# Patient Record
Sex: Female | Born: 1990 | Race: White | Hispanic: No | Marital: Single | Smoking: Never smoker
Health system: Southern US, Community
[De-identification: ages and names within clinical notes are randomized; demographics above are authoritative.]

## PROBLEM LIST (undated history)

## (undated) DIAGNOSIS — Z803 Family history of malignant neoplasm of breast: Secondary | ICD-10-CM

## (undated) DIAGNOSIS — Z808 Family history of malignant neoplasm of other organs or systems: Secondary | ICD-10-CM

## (undated) DIAGNOSIS — Z8041 Family history of malignant neoplasm of ovary: Secondary | ICD-10-CM

## (undated) HISTORY — DX: Family history of malignant neoplasm of breast: Z80.3

## (undated) HISTORY — DX: Family history of malignant neoplasm of other organs or systems: Z80.8

## (undated) HISTORY — DX: Family history of malignant neoplasm of ovary: Z80.41

---
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
## 2008-05-17 ENCOUNTER — Other Ambulatory Visit: Admission: RE | Admit: 2008-05-17 | Discharge: 2008-05-17 | Payer: Self-pay | Admitting: Family Medicine

## 2009-10-08 ENCOUNTER — Other Ambulatory Visit: Admission: RE | Admit: 2009-10-08 | Discharge: 2009-10-08 | Payer: Self-pay | Admitting: Family Medicine

## 2010-10-10 ENCOUNTER — Other Ambulatory Visit (HOSPITAL_COMMUNITY)
Admission: RE | Admit: 2010-10-10 | Discharge: 2010-10-10 | Disposition: A | Discharge status: Home / Self Care | Payer: Commercial Indemnity | Source: Ambulatory Visit | Attending: Family Medicine | Admitting: Family Medicine

## 2010-10-10 ENCOUNTER — Other Ambulatory Visit: Payer: Self-pay | Admitting: Family Medicine

## 2010-10-10 ENCOUNTER — Other Ambulatory Visit (HOSPITAL_COMMUNITY): Admission: RE | Admit: 2010-10-10 | Payer: Commercial Indemnity | Source: Ambulatory Visit | Admitting: Family Medicine

## 2010-10-10 DIAGNOSIS — Z124 Encounter for screening for malignant neoplasm of cervix: Secondary | ICD-10-CM | POA: Insufficient documentation

## 2010-10-10 DIAGNOSIS — Z113 Encounter for screening for infections with a predominantly sexual mode of transmission: Secondary | ICD-10-CM | POA: Insufficient documentation

## 2011-10-12 ENCOUNTER — Other Ambulatory Visit (HOSPITAL_COMMUNITY)
Admission: RE | Admit: 2011-10-12 | Discharge: 2011-10-12 | Disposition: A | Discharge status: Home / Self Care | Payer: Commercial Managed Care - PPO | Source: Ambulatory Visit | Attending: Family Medicine | Admitting: Family Medicine

## 2011-10-12 ENCOUNTER — Other Ambulatory Visit: Payer: Self-pay | Admitting: Family Medicine

## 2011-10-12 DIAGNOSIS — Z124 Encounter for screening for malignant neoplasm of cervix: Secondary | ICD-10-CM | POA: Insufficient documentation

## 2011-10-12 DIAGNOSIS — Z113 Encounter for screening for infections with a predominantly sexual mode of transmission: Secondary | ICD-10-CM | POA: Insufficient documentation

## 2012-10-19 ENCOUNTER — Other Ambulatory Visit (HOSPITAL_COMMUNITY)
Admission: RE | Admit: 2012-10-19 | Discharge: 2012-10-19 | Disposition: A | Discharge status: Home / Self Care | Payer: Commercial Managed Care - PPO | Source: Ambulatory Visit | Attending: Family Medicine | Admitting: Family Medicine

## 2012-10-19 ENCOUNTER — Other Ambulatory Visit: Payer: Self-pay | Admitting: Family Medicine

## 2012-10-19 DIAGNOSIS — Z124 Encounter for screening for malignant neoplasm of cervix: Secondary | ICD-10-CM | POA: Insufficient documentation

## 2012-10-19 DIAGNOSIS — Z113 Encounter for screening for infections with a predominantly sexual mode of transmission: Secondary | ICD-10-CM | POA: Insufficient documentation

## 2013-11-16 ENCOUNTER — Other Ambulatory Visit (HOSPITAL_COMMUNITY)
Admission: RE | Admit: 2013-11-16 | Discharge: 2013-11-16 | Disposition: A | Discharge status: Home / Self Care | Payer: Commercial Managed Care - PPO | Source: Ambulatory Visit | Attending: Family Medicine | Admitting: Family Medicine

## 2013-11-16 ENCOUNTER — Other Ambulatory Visit: Payer: Self-pay | Admitting: Family Medicine

## 2013-11-16 DIAGNOSIS — Z113 Encounter for screening for infections with a predominantly sexual mode of transmission: Secondary | ICD-10-CM | POA: Insufficient documentation

## 2013-11-16 DIAGNOSIS — Z124 Encounter for screening for malignant neoplasm of cervix: Secondary | ICD-10-CM | POA: Insufficient documentation

## 2013-11-16 DIAGNOSIS — Z1151 Encounter for screening for human papillomavirus (HPV): Secondary | ICD-10-CM | POA: Insufficient documentation

## 2015-10-15 ENCOUNTER — Other Ambulatory Visit (HOSPITAL_COMMUNITY)
Admission: RE | Admit: 2015-10-15 | Discharge: 2015-10-15 | Disposition: A | Discharge status: Home / Self Care | Payer: 59 | Source: Ambulatory Visit | Attending: Family Medicine | Admitting: Family Medicine

## 2015-10-15 ENCOUNTER — Other Ambulatory Visit: Payer: Self-pay | Admitting: Family Medicine

## 2015-10-15 DIAGNOSIS — Z1151 Encounter for screening for human papillomavirus (HPV): Secondary | ICD-10-CM | POA: Insufficient documentation

## 2015-10-15 DIAGNOSIS — Z124 Encounter for screening for malignant neoplasm of cervix: Secondary | ICD-10-CM | POA: Insufficient documentation

## 2015-10-17 ENCOUNTER — Other Ambulatory Visit: Payer: Self-pay | Admitting: Family Medicine

## 2015-10-17 DIAGNOSIS — Z8041 Family history of malignant neoplasm of ovary: Secondary | ICD-10-CM

## 2015-10-17 DIAGNOSIS — N6459 Other signs and symptoms in breast: Secondary | ICD-10-CM

## 2015-10-18 LAB — CYTOLOGY - PAP

## 2015-10-23 ENCOUNTER — Ambulatory Visit
Admission: RE | Admit: 2015-10-23 | Discharge: 2015-10-23 | Disposition: A | Discharge status: Home / Self Care | Payer: 59 | Source: Ambulatory Visit | Attending: Family Medicine | Admitting: Family Medicine

## 2015-10-23 ENCOUNTER — Other Ambulatory Visit: Payer: Self-pay | Admitting: Family Medicine

## 2015-10-23 DIAGNOSIS — N6459 Other signs and symptoms in breast: Secondary | ICD-10-CM

## 2015-10-30 ENCOUNTER — Ambulatory Visit
Admission: RE | Admit: 2015-10-30 | Discharge: 2015-10-30 | Disposition: A | Discharge status: Home / Self Care | Payer: 59 | Source: Ambulatory Visit | Attending: Family Medicine | Admitting: Family Medicine

## 2015-10-30 DIAGNOSIS — Z8041 Family history of malignant neoplasm of ovary: Secondary | ICD-10-CM

## 2016-04-22 ENCOUNTER — Other Ambulatory Visit: Payer: Self-pay | Admitting: Family Medicine

## 2016-04-22 ENCOUNTER — Inpatient Hospital Stay (HOSPITAL_COMMUNITY): Admission: RE | Admit: 2016-04-22 | Payer: Self-pay | Source: Ambulatory Visit

## 2016-04-23 LAB — CYTOLOGY - PAP

## 2016-10-23 DIAGNOSIS — R002 Palpitations: Secondary | ICD-10-CM | POA: Diagnosis not present

## 2016-10-23 DIAGNOSIS — Z8041 Family history of malignant neoplasm of ovary: Secondary | ICD-10-CM | POA: Diagnosis not present

## 2016-10-23 DIAGNOSIS — Z309 Encounter for contraceptive management, unspecified: Secondary | ICD-10-CM | POA: Diagnosis not present

## 2016-10-23 DIAGNOSIS — Z803 Family history of malignant neoplasm of breast: Secondary | ICD-10-CM | POA: Diagnosis not present

## 2016-10-23 DIAGNOSIS — Z0000 Encounter for general adult medical examination without abnormal findings: Secondary | ICD-10-CM | POA: Diagnosis not present

## 2016-10-23 DIAGNOSIS — Z23 Encounter for immunization: Secondary | ICD-10-CM | POA: Diagnosis not present

## 2017-07-14 DIAGNOSIS — M9903 Segmental and somatic dysfunction of lumbar region: Secondary | ICD-10-CM | POA: Diagnosis not present

## 2017-07-14 DIAGNOSIS — R51 Headache: Secondary | ICD-10-CM | POA: Diagnosis not present

## 2017-07-14 DIAGNOSIS — M5431 Sciatica, right side: Secondary | ICD-10-CM | POA: Diagnosis not present

## 2017-07-14 DIAGNOSIS — M9901 Segmental and somatic dysfunction of cervical region: Secondary | ICD-10-CM | POA: Diagnosis not present

## 2017-07-15 DIAGNOSIS — M9903 Segmental and somatic dysfunction of lumbar region: Secondary | ICD-10-CM | POA: Diagnosis not present

## 2017-07-15 DIAGNOSIS — M9901 Segmental and somatic dysfunction of cervical region: Secondary | ICD-10-CM | POA: Diagnosis not present

## 2017-07-15 DIAGNOSIS — R51 Headache: Secondary | ICD-10-CM | POA: Diagnosis not present

## 2017-07-15 DIAGNOSIS — M5431 Sciatica, right side: Secondary | ICD-10-CM | POA: Diagnosis not present

## 2017-07-19 DIAGNOSIS — M9903 Segmental and somatic dysfunction of lumbar region: Secondary | ICD-10-CM | POA: Diagnosis not present

## 2017-07-19 DIAGNOSIS — R51 Headache: Secondary | ICD-10-CM | POA: Diagnosis not present

## 2017-07-19 DIAGNOSIS — M5431 Sciatica, right side: Secondary | ICD-10-CM | POA: Diagnosis not present

## 2017-07-19 DIAGNOSIS — M9901 Segmental and somatic dysfunction of cervical region: Secondary | ICD-10-CM | POA: Diagnosis not present

## 2017-07-21 DIAGNOSIS — M9901 Segmental and somatic dysfunction of cervical region: Secondary | ICD-10-CM | POA: Diagnosis not present

## 2017-07-21 DIAGNOSIS — M5431 Sciatica, right side: Secondary | ICD-10-CM | POA: Diagnosis not present

## 2017-07-21 DIAGNOSIS — R51 Headache: Secondary | ICD-10-CM | POA: Diagnosis not present

## 2017-07-21 DIAGNOSIS — M9903 Segmental and somatic dysfunction of lumbar region: Secondary | ICD-10-CM | POA: Diagnosis not present

## 2017-07-22 DIAGNOSIS — M5431 Sciatica, right side: Secondary | ICD-10-CM | POA: Diagnosis not present

## 2017-07-22 DIAGNOSIS — R51 Headache: Secondary | ICD-10-CM | POA: Diagnosis not present

## 2017-07-22 DIAGNOSIS — M9901 Segmental and somatic dysfunction of cervical region: Secondary | ICD-10-CM | POA: Diagnosis not present

## 2017-07-22 DIAGNOSIS — M9903 Segmental and somatic dysfunction of lumbar region: Secondary | ICD-10-CM | POA: Diagnosis not present

## 2017-07-26 DIAGNOSIS — M9901 Segmental and somatic dysfunction of cervical region: Secondary | ICD-10-CM | POA: Diagnosis not present

## 2017-07-26 DIAGNOSIS — M5431 Sciatica, right side: Secondary | ICD-10-CM | POA: Diagnosis not present

## 2017-07-26 DIAGNOSIS — M9903 Segmental and somatic dysfunction of lumbar region: Secondary | ICD-10-CM | POA: Diagnosis not present

## 2017-07-26 DIAGNOSIS — R51 Headache: Secondary | ICD-10-CM | POA: Diagnosis not present

## 2017-07-27 DIAGNOSIS — M9903 Segmental and somatic dysfunction of lumbar region: Secondary | ICD-10-CM | POA: Diagnosis not present

## 2017-07-27 DIAGNOSIS — R51 Headache: Secondary | ICD-10-CM | POA: Diagnosis not present

## 2017-07-27 DIAGNOSIS — M5431 Sciatica, right side: Secondary | ICD-10-CM | POA: Diagnosis not present

## 2017-07-27 DIAGNOSIS — M9901 Segmental and somatic dysfunction of cervical region: Secondary | ICD-10-CM | POA: Diagnosis not present

## 2017-08-02 DIAGNOSIS — M9903 Segmental and somatic dysfunction of lumbar region: Secondary | ICD-10-CM | POA: Diagnosis not present

## 2017-08-02 DIAGNOSIS — R51 Headache: Secondary | ICD-10-CM | POA: Diagnosis not present

## 2017-08-02 DIAGNOSIS — M9901 Segmental and somatic dysfunction of cervical region: Secondary | ICD-10-CM | POA: Diagnosis not present

## 2017-08-02 DIAGNOSIS — M5431 Sciatica, right side: Secondary | ICD-10-CM | POA: Diagnosis not present

## 2017-08-04 DIAGNOSIS — M9903 Segmental and somatic dysfunction of lumbar region: Secondary | ICD-10-CM | POA: Diagnosis not present

## 2017-08-04 DIAGNOSIS — R51 Headache: Secondary | ICD-10-CM | POA: Diagnosis not present

## 2017-08-04 DIAGNOSIS — M5431 Sciatica, right side: Secondary | ICD-10-CM | POA: Diagnosis not present

## 2017-08-04 DIAGNOSIS — M9901 Segmental and somatic dysfunction of cervical region: Secondary | ICD-10-CM | POA: Diagnosis not present

## 2017-08-06 DIAGNOSIS — M9901 Segmental and somatic dysfunction of cervical region: Secondary | ICD-10-CM | POA: Diagnosis not present

## 2017-08-06 DIAGNOSIS — M5431 Sciatica, right side: Secondary | ICD-10-CM | POA: Diagnosis not present

## 2017-08-06 DIAGNOSIS — R51 Headache: Secondary | ICD-10-CM | POA: Diagnosis not present

## 2017-08-06 DIAGNOSIS — M9903 Segmental and somatic dysfunction of lumbar region: Secondary | ICD-10-CM | POA: Diagnosis not present

## 2017-08-11 DIAGNOSIS — R51 Headache: Secondary | ICD-10-CM | POA: Diagnosis not present

## 2017-08-11 DIAGNOSIS — M9901 Segmental and somatic dysfunction of cervical region: Secondary | ICD-10-CM | POA: Diagnosis not present

## 2017-08-11 DIAGNOSIS — M5431 Sciatica, right side: Secondary | ICD-10-CM | POA: Diagnosis not present

## 2017-08-11 DIAGNOSIS — M9903 Segmental and somatic dysfunction of lumbar region: Secondary | ICD-10-CM | POA: Diagnosis not present

## 2017-08-13 DIAGNOSIS — M9901 Segmental and somatic dysfunction of cervical region: Secondary | ICD-10-CM | POA: Diagnosis not present

## 2017-08-13 DIAGNOSIS — M9903 Segmental and somatic dysfunction of lumbar region: Secondary | ICD-10-CM | POA: Diagnosis not present

## 2017-08-13 DIAGNOSIS — R51 Headache: Secondary | ICD-10-CM | POA: Diagnosis not present

## 2017-08-13 DIAGNOSIS — M5431 Sciatica, right side: Secondary | ICD-10-CM | POA: Diagnosis not present

## 2017-11-26 DIAGNOSIS — Z0000 Encounter for general adult medical examination without abnormal findings: Secondary | ICD-10-CM | POA: Diagnosis not present

## 2017-11-26 DIAGNOSIS — Z8041 Family history of malignant neoplasm of ovary: Secondary | ICD-10-CM | POA: Diagnosis not present

## 2017-11-29 ENCOUNTER — Other Ambulatory Visit: Payer: Self-pay | Admitting: Family Medicine

## 2017-11-29 DIAGNOSIS — Z8041 Family history of malignant neoplasm of ovary: Secondary | ICD-10-CM

## 2017-12-08 ENCOUNTER — Encounter: Payer: Self-pay | Admitting: Genetic Counselor

## 2017-12-08 ENCOUNTER — Telehealth: Payer: Self-pay | Admitting: Genetic Counselor

## 2017-12-08 NOTE — Telephone Encounter (Signed)
Genetic counseling appt has been scheduled for the pt to see Maylon CosKaren Powell on 5/13 at 9am. Pt aware to arrive 30 minutes early. Letter mailed.

## 2017-12-13 ENCOUNTER — Ambulatory Visit
Admission: RE | Admit: 2017-12-13 | Discharge: 2017-12-13 | Disposition: A | Discharge status: Home / Self Care | Payer: 59 | Source: Ambulatory Visit | Attending: Family Medicine | Admitting: Family Medicine

## 2017-12-13 DIAGNOSIS — Z3009 Encounter for other general counseling and advice on contraception: Secondary | ICD-10-CM | POA: Diagnosis not present

## 2017-12-13 DIAGNOSIS — Z8041 Family history of malignant neoplasm of ovary: Secondary | ICD-10-CM

## 2018-01-24 ENCOUNTER — Inpatient Hospital Stay: Payer: BLUE CROSS/BLUE SHIELD | Attending: Genetic Counselor | Admitting: Genetic Counselor

## 2018-01-24 ENCOUNTER — Encounter: Payer: Self-pay | Admitting: Genetic Counselor

## 2018-01-24 ENCOUNTER — Inpatient Hospital Stay: Payer: BLUE CROSS/BLUE SHIELD

## 2018-01-24 DIAGNOSIS — Z1379 Encounter for other screening for genetic and chromosomal anomalies: Secondary | ICD-10-CM

## 2018-01-24 DIAGNOSIS — Z803 Family history of malignant neoplasm of breast: Secondary | ICD-10-CM

## 2018-01-24 DIAGNOSIS — Z808 Family history of malignant neoplasm of other organs or systems: Secondary | ICD-10-CM | POA: Insufficient documentation

## 2018-01-24 DIAGNOSIS — Z8041 Family history of malignant neoplasm of ovary: Secondary | ICD-10-CM

## 2018-01-24 NOTE — Progress Notes (Signed)
REFERRING PROVIDER: Leighton Ruff, MD [place], [place] [address]  PRIMARY PROVIDER:  Leighton Ruff, MD  PRIMARY REASON FOR VISIT:  1. Family history of breast cancer   2. Family history of ovarian cancer   3. Family history of brain cancer      HISTORY OF PRESENT ILLNESS:   Ashley Dougherty, a 27 y.o. female, was seen for a [facility] cancer genetics consultation at the request of Dr. Drema Dallas due to a family history of cancer.  Ashley Dougherty presents to clinic today to discuss the possibility of a hereditary predisposition to cancer, genetic testing, and to further clarify her future cancer risks, as well as potential cancer risks for family members.   Ashley Dougherty is a 28 y.o. female with no personal history of cancer.  There is a family history of both a BRCA1 mutation and BARD1 mutation, running in her maternal family.  Her mother has tested negative for the familial BRCA1 mutation, but is positive for the BARD1 mutation.  CANCER HISTORY:   No history exists.     HORMONAL RISK FACTORS:  Menarche was at age 10.  First live birth at age N/A.  OCP use for approximately 11 years.  Ovaries intact: yes.  Hysterectomy: no.  Menopausal status: premenopausal.  HRT use: 0 years. Colonoscopy: no; not examined. Mammogram within the last year: no. Number of breast biopsies: 0. Up to date with pelvic exams:  yes. Any excessive radiation exposure in the past:  no  Past Medical History:  Diagnosis Date  . Family history of brain cancer   . Family history of breast cancer   . Family history of ovarian cancer       Social History   Socioeconomic History  . Marital status: Single    Spouse name: Not on file  . Number of children: Not on file  . Years of education: Not on file  . Highest education level: Not on file  Occupational History  . Not on file  Social Needs  . Financial resource strain: Not on file  . Food insecurity:    Worry: Not on file     Inability: Not on file  . Transportation needs:    Medical: Not on file    Non-medical: Not on file  Tobacco Use  . Smoking status: Never Smoker  . Smokeless tobacco: Never Used  Substance and Sexual Activity  . Alcohol use: Not on file  . Drug use: Not on file  . Sexual activity: Not on file  Lifestyle  . Physical activity:    Days per week: Not on file    Minutes per session: Not on file  . Stress: Not on file  Relationships  . Social connections:    Talks on phone: Not on file    Gets together: Not on file    Attends religious service: Not on file    Active member of club or organization: Not on file    Attends meetings of clubs or organizations: Not on file    Relationship status: Not on file  Other Topics Concern  . Not on file  Social History Narrative  . Not on file     FAMILY HISTORY:  We obtained a detailed, 4-generation family history.  Significant diagnoses are listed below: Family History  Problem Relation Age of Onset  . Thyroid cancer Mother 31       papillary  . Testicular cancer Father 35  . Kidney cancer Father 47  .  Lung cancer Father 26  . Brain cancer Paternal Aunt        glioblastoma dx in her 45s  . HIV/AIDS Paternal Uncle   . Breast cancer Maternal Grandmother 62       d. 20  . Bladder Cancer Maternal Grandfather 37       d. 60  . Ovarian cancer Maternal Aunt 59  . BRCA 1/2 Maternal Aunt        BRCA pos  . Breast cancer Paternal Aunt        dx in her late 13s  . Ovarian cancer Cousin 35    The patient does not have children.  She has one brother who is cancer free.  Both parents are living.  The patient's mother had thyroid cancer at 56.  She has three sisters, one who had ovarian cancer and is BRCA1 positive.  A second sister does not have cancer, but had a daughter who had ovarian cancer at 68 and was negative for BRCA1.  The maternal grandparents are both deceased.  The grandmother had breast cancer at 64 and the grandfather had  bladder cancer at 19.  The patients' father had testicular cancer at 57, kidney cancer at 75 and lung cancer (primary cancer, stage IIIB) at 56.  He had two brothers and four sisters.  One sister was diagnosed with breast cancer in her 75's and died at 66 and a second sister had a glioblastoma in her 15's.  Both paternal grandparents are deceased.  Patient's maternal ancestors are of [place] and [place] descent, and paternal ancestors are of [place] descent. There is no reported Ashkenazi Jewish ancestry. There is no known consanguinity.  GENETIC COUNSELING ASSESSMENT: Ashley Dougherty is a 27 y.o. female with a family history of breast, ovarian and brain cancer and a known BRCA1 and BARD1 mutation which is somewhat suggestive of a hereditary cancer syndrome and predisposition to cancer. We, therefore, discussed and recommended the following at today's visit.   DISCUSSION: We discussed that about 5-10% of breast cancer is hereditary with most cases due to BRCA mutations.  There are other genes associated with hereditary breast and ovarian cancer syndrome.  The patient has a family history of both a BRCA1 and BARD1 mutation.  Her mother underwent genetic testing and was negative for the BRCA1 mutations, but did test positive for the BARD1 mutation.  We discussed that she has a 50% chance of testing positive for a BARD1 mutation, but is not at risk for the BRCA1 mutation.  The patient has a family history of breast, brain and other cancers on the paternal side of the family.  While the likelihood of a hereditary cancer syndrome is low, her aunt was diagnosed in her 38's and therefore we could look at other hereditary breast cancer genes.  We reviewed the characteristics, features and inheritance patterns of hereditary cancer syndromes. We also discussed genetic testing, including the appropriate family members to test, the process of testing, insurance coverage and turn-around-time for results. We discussed the  implications of a negative, positive and/or variant of uncertain significant result. We recommended Ashley Dougherty pursue genetic testing for the multi cancer gene panel. The Multi-Gene Panel offered by [facility] includes sequencing and/or deletion duplication testing of the following 83 genes: ALK, APC, ATM, AXIN2,BAP1,  BARD1, BLM, BMPR1A, BRCA1, BRCA2, BRIP1, CASR, CDC73, CDH1, CDK4, CDKN1B, CDKN1C, CDKN2A (p14ARF), CDKN2A (p16INK4a), CEBPA, CHEK2, CTNNA1, DICER1, DIS3L2, EGFR (c.2369C>T, p.Thr790Met variant only), EPCAM (Deletion/duplication testing only), FH, FLCN, GATA2, GPC3, GREM1 (  Promoter region deletion/duplication testing only), HOXB13 (c.251G>A, p.Gly84Glu), HRAS, KIT, MAX, MEN1, MET, MITF (c.952G>A, p.Glu318Lys variant only), MLH1, MSH2, MSH3, MSH6, MUTYH, NBN, NF1, NF2, NTHL1, PALB2, PDGFRA, PHOX2B, PMS2, POLD1, POLE, POT1, PRKAR1A, PTCH1, PTEN, RAD50, RAD51C, RAD51D, RB1, RECQL4, RET, RUNX1, SDHAF2, SDHA (sequence changes only), SDHB, SDHC, SDHD, SMAD4, SMARCA4, SMARCB1, SMARCE1, STK11, SUFU, TERT, TERT, TMEM127, TP53, TSC1, TSC2, VHL, WRN and WT1.    Based on Ashley Dougherty's family history of cancer, she meets medical criteria for genetic testing. Despite that she meets criteria, she may still have an out of pocket cost. We discussed that if her out of pocket cost for testing is over $100, the laboratory will call and confirm whether she wants to proceed with testing.  If the out of pocket cost of testing is less than $100 she will be billed by the genetic testing laboratory.   PLAN: After considering the risks, benefits, and limitations, Ashley Dougherty  provided informed consent to pursue genetic testing and the blood sample was sent to [facility] for analysis of the Multi cancer panel. Results should be available within approximately 2-3 weeks' time, at which point they will be disclosed by telephone to Ashley Dougherty, as will any additional recommendations warranted by these results. Ashley Dougherty will  receive a summary of her genetic counseling visit and a copy of her results once available. This information will also be available in Epic. We encouraged Ashley Dougherty to remain in contact with cancer genetics annually so that we can continuously update the family history and inform her of any changes in cancer genetics and testing that may be of benefit for her family. Ashley Dougherty questions were answered to her satisfaction today. Our contact information was provided should additional questions or concerns arise.  Lastly, we encouraged Ashley Dougherty to remain in contact with cancer genetics annually so that we can continuously update the family history and inform her of any changes in cancer genetics and testing that may be of benefit for this family.   Ms.  Dougherty questions were answered to her satisfaction today. Our contact information was provided should additional questions or concerns arise. Thank you for the referral and allowing [place] to share in the care of your patient.   Ashley Dougherty P. Florene Glen, [place], [facility] Certified Genetic Counselor [email or web address] phone: [phone]  The patient was seen for a total of 50 minutes in face-to-face genetic counseling.  This patient was discussed with Drs. Magrinat, Lindi Adie and/or Burr Medico who agrees with the above.    _______________________________________________________________________ For Office Staff:  Number of people involved in session: 1 Was an Intern/ student involved with case: no

## 2018-02-08 ENCOUNTER — Encounter: Payer: Self-pay | Admitting: Genetic Counselor

## 2018-02-08 ENCOUNTER — Telehealth: Payer: Self-pay | Admitting: Genetic Counselor

## 2018-02-08 ENCOUNTER — Ambulatory Visit: Payer: Self-pay | Admitting: Genetic Counselor

## 2018-02-08 DIAGNOSIS — Z8041 Family history of malignant neoplasm of ovary: Secondary | ICD-10-CM

## 2018-02-08 DIAGNOSIS — Z1379 Encounter for other screening for genetic and chromosomal anomalies: Secondary | ICD-10-CM

## 2018-02-08 DIAGNOSIS — Z803 Family history of malignant neoplasm of breast: Secondary | ICD-10-CM

## 2018-02-08 DIAGNOSIS — Z808 Family history of malignant neoplasm of other organs or systems: Secondary | ICD-10-CM

## 2018-02-08 NOTE — Progress Notes (Addendum)
HPI:  Ashley Dougherty was previously seen in the [facility] clinic due to a family history of cancer and concerns regarding a hereditary predisposition to cancer. Please refer to our prior cancer genetics clinic note for more information regarding Ashley Dougherty's medical, social and family histories, and our assessment and recommendations, at the time. Ashley Dougherty recent genetic test results were disclosed to her, as were recommendations warranted by these results. These results and recommendations are discussed in more detail below.  CANCER HISTORY:   No history exists.    FAMILY HISTORY:  We obtained a detailed, 4-generation family history.  Significant diagnoses are listed below: Family History  Problem Relation Age of Onset  . Thyroid cancer Mother 19       papillary  . Testicular cancer Father 66  . Kidney cancer Father 52  . Lung cancer Father 56  . Brain cancer Paternal Aunt        glioblastoma dx in her 40s  . HIV/AIDS Paternal Uncle   . Breast cancer Maternal Grandmother 62       d. 61  . Bladder Cancer Maternal Grandfather 77       d. 17  . Ovarian cancer Maternal Aunt 59  . BRCA 1/2 Maternal Aunt        BRCA pos  . Breast cancer Paternal Aunt        dx in her late 83s  . Ovarian cancer Cousin 65    The patient does not have children.  She has one brother who is cancer free.  Both parents are living.  The patient's mother had thyroid cancer at 45.  She has three sisters, one who had ovarian cancer and is BRCA1 positive.  A second sister does not have cancer, but had a daughter who had ovarian cancer at 84 and was negative for BRCA1.  The maternal grandparents are both deceased.  The grandmother had breast cancer at 33 and the grandfather had bladder cancer at 46.  The patients' father had testicular cancer at 97, kidney cancer at 70 and lung cancer (primary cancer, stage IIIB) at 2.  He had two brothers and four sisters.  One sister was diagnosed with breast  cancer in her 37's and died at 65 and a second sister had a glioblastoma in her 77's.  Both paternal grandparents are deceased.  Patient's maternal ancestors are of [place] and [place] descent, and paternal ancestors are of English descent. There is no reported Ashkenazi Jewish ancestry. There is no known consanguinity.  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 There were no deleterious mutations in ALK, APC, ATM, AXIN2,BAP1, BLM, BMPR1A, BRCA1, BRCA2, BRIP1, CASR, CDC73, CDH1, CDK4, CDKN1B, CDKN1C, CDKN2A (p14ARF), CDKN2A (p16INK4a), CEBPA, CHEK2, CTNNA1, DICER1, DIS3L2, EGFR (c.2369C>T, p.Thr790Met variant only), EPCAM (Deletion/duplication testing only), FH, FLCN, GATA2, GPC3, GREM1 (Promoter region deletion/duplication testing only), HOXB13 (c.251G>A, p.Gly84Glu), HRAS, KIT, MAX, MEN1, MET, MITF (c.952G>A, p.Glu318Lys variant only), MLH1, MSH2, MSH3, MSH6, MUTYH, NBN, NF1, NF2, NTHL1, PALB2, PDGFRA, PHOX2B, PMS2, POLD1, POLE, POT1, PRKAR1A, PTCH1, PTEN, RAD50, RAD51C, RAD51D, RB1, RECQL4, RET, RUNX1, SDHAF2, SDHA (sequence changes only), SDHB, SDHC, SDHD, SMAD4, SMARCA4, SMARCB1, SMARCE1, STK11, SUFU, TERT, TERT, TMEM127, TP53, TSC1, TSC2, VHL, WRN and WT1. The test report has been scanned into EPIC and is located under the Molecular Pathology section of the Results Review tab.    DISCUSSION Women who  are carriers of a single pathogenic BARD1 variant have an increased risk for breast cancer, although specific risks are not yet determined (PMID: 33007622 [id number] ). There may also be an increased risk for ovarian cancer, but the current evidence is preliminary (PMID: 25638937 [id number] [id number] ). An individual with a BARD1 pathogenic variant will not necessarily develop cancer in her lifetime, but the risk for cancer is  increased over the general population risk.  INHERITANCE: Hereditary predisposition to cancer due to pathogenic variants in the BARD1 gene has autosomal dominant inheritance. This means that an individual with a pathogenic variant has a 50% chance of passing the condition on to their offspring. Once a pathogenic mutation is detected in an individual, it is possible to identify at-risk relatives who can pursue testing for this specific familial variant. Many cases are inherited from a parent, but some cases can occur spontaneously (i.e., an individual with a pathogenic variant has parents who do not have it). An individual with a variant in [place] has a 50% risk of passing that variant on to offspring.  MANAGEMENT: The [employer]  (NCCN) currently does not recommend additional breast cancer screening for individuals with a single pathogenic BARD1 variant beyond what is recommended for the general population. However, they caution that cancer screening should ultimately be guided by personal and family history (The [employer] . Breast and Ovarian Management Based on Genetic Test Results. Version 3.2019).  An individual's cancer risk and medical management are not determined by genetic test results alone. Overall cancer risk assessment incorporates additional factors, including personal medical history, family history, and any available genetic information that may result in a personalized plan for cancer prevention and surveillance.  Even though the data regarding specific cancer risk estimates with pathogenic BARD1 variants are limited, knowing if a pathogenic variant is present is advantageous. At-risk relatives can be identified, enabling pursuit of a diagnostic evaluation. Further, the available information regarding hereditary cancer susceptibility genes is constantly evolving and more clinically relevant data regarding BARD1 are likely to become  available in the near future. Awareness of this cancer predisposition encourages patients and their providers to inform at-risk family members, to diligently follow standard screening protocols, and to be vigilant in maintaining close and regular contact with their local genetics clinic in anticipation of new information.   CANCER SCREENING RECOMMENDATIONS: This normal result is reassuring and indicates that Ashley Dougherty does not likely have an increased risk of cancer due to a mutation in one of these genes.  We, therefore, recommended  Ashley Dougherty continue to follow the cancer screening guidelines provided by her primary healthcare providers.   An individual's cancer risk and medical management are not determined by genetic test results alone. Overall cancer risk assessment incorporates additional factors, including personal medical history, family history, and any available genetic information that may result in a personalized plan for cancer prevention and surveillance.  Based on Ashley Dougherty's personal and family history of cancer, as well as her genetic test results, statistical models (Tyrer Cusik)  and literature data were used to estimate her risk of developing breast cancer. ThThis one model estimates her lifetime risk of developing breast cancer to be approximately 17.4%.  The patient's lifetime breast cancer risk is a preliminary estimate based on available information using one of several models endorsed by the [place] (ACS). The ACS recommends consideration of breast MRI screening as an adjunct to mammography for patients at high risk (defined as 20%  or greater lifetime risk). A more detailed breast cancer risk assessment can be considered, if clinically indicated.    RECOMMENDATIONS FOR FAMILY MEMBERS:  Women in this family might be at some increased risk of developing cancer, over the general population risk, simply due to the family history of cancer as well as the BARD1  mutation.  We recommended women in this family have a yearly mammogram beginning at age 55, or 27 years younger than the earliest onset of cancer, an annual clinical breast exam, and perform monthly breast self-exams. Women in this family should also have a gynecological exam as recommended by their primary provider. All family members should have a colonoscopy by age 4.  FOLLOW-UP: Lastly, we discussed with Ashley Dougherty that cancer genetics is a rapidly advancing field and it is possible that new genetic tests will be appropriate for her and/or her family members in the future. We encouraged her to remain in contact with cancer genetics on an annual basis so we can update her personal and family histories and let her know of advances in cancer genetics that may benefit this family.   Our contact number was provided. Ashley Dougherty questions were answered to her satisfaction, and she knows she is welcome to call us at anytime with additional questions or concerns.   Roma Kayser, MS, [facility] Certified Genetic Counselor [email or web address]

## 2018-02-08 NOTE — Telephone Encounter (Signed)
LM that results are back and to please CB

## 2018-02-08 NOTE — Telephone Encounter (Signed)
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  She was negative for other hereditary cancer syndrome mutations.  Discussed that NCCN suggests that there is an increased risk for breast cancer, but that the risk is not well defined.  There does not seem to have been a risk for ovarian cancer.  Performed a TC, which shows that she has an increased risk for breast cancer of 17.4% but that is not high enough to warrant an MRI.  Discussed that patient may want to get more information about ages of onset of breast and ovarian cancer in her family, but most likely it will not change her risk assessment much.

## 2018-11-16 DIAGNOSIS — R51 Headache: Secondary | ICD-10-CM | POA: Diagnosis not present

## 2018-12-12 ENCOUNTER — Other Ambulatory Visit (HOSPITAL_COMMUNITY)
Admission: RE | Admit: 2018-12-12 | Discharge: 2018-12-12 | Disposition: A | Discharge status: Home / Self Care | Payer: BLUE CROSS/BLUE SHIELD | Source: Ambulatory Visit | Attending: Family Medicine | Admitting: Family Medicine

## 2018-12-12 ENCOUNTER — Other Ambulatory Visit: Payer: Self-pay | Admitting: Family Medicine

## 2018-12-12 DIAGNOSIS — Z8249 Family history of ischemic heart disease and other diseases of the circulatory system: Secondary | ICD-10-CM | POA: Diagnosis not present

## 2018-12-12 DIAGNOSIS — Z124 Encounter for screening for malignant neoplasm of cervix: Secondary | ICD-10-CM | POA: Insufficient documentation

## 2018-12-12 DIAGNOSIS — E7800 Pure hypercholesterolemia, unspecified: Secondary | ICD-10-CM | POA: Diagnosis not present

## 2018-12-12 DIAGNOSIS — Z0000 Encounter for general adult medical examination without abnormal findings: Secondary | ICD-10-CM

## 2018-12-12 DIAGNOSIS — Z6832 Body mass index (BMI) 32.0-32.9, adult: Secondary | ICD-10-CM | POA: Diagnosis not present

## 2018-12-12 DIAGNOSIS — Z8041 Family history of malignant neoplasm of ovary: Secondary | ICD-10-CM | POA: Diagnosis not present

## 2018-12-12 DIAGNOSIS — R635 Abnormal weight gain: Secondary | ICD-10-CM | POA: Diagnosis not present

## 2018-12-15 LAB — CYTOLOGY - PAP
Chlamydia: NEGATIVE
Diagnosis: NEGATIVE
Neisseria Gonorrhea: NEGATIVE

## 2019-01-30 ENCOUNTER — Other Ambulatory Visit: Payer: BLUE CROSS/BLUE SHIELD

## 2019-02-14 ENCOUNTER — Ambulatory Visit
Admission: RE | Admit: 2019-02-14 | Discharge: 2019-02-14 | Disposition: A | Discharge status: Home / Self Care | Payer: BC Managed Care – PPO | Source: Ambulatory Visit | Attending: Family Medicine | Admitting: Family Medicine

## 2019-02-14 DIAGNOSIS — F411 Generalized anxiety disorder: Secondary | ICD-10-CM | POA: Diagnosis not present

## 2019-02-14 DIAGNOSIS — S46819A Strain of other muscles, fascia and tendons at shoulder and upper arm level, unspecified arm, initial encounter: Secondary | ICD-10-CM | POA: Diagnosis not present

## 2019-02-14 DIAGNOSIS — Z0000 Encounter for general adult medical examination without abnormal findings: Secondary | ICD-10-CM

## 2019-02-14 DIAGNOSIS — Z8041 Family history of malignant neoplasm of ovary: Secondary | ICD-10-CM | POA: Diagnosis not present

## 2019-02-14 DIAGNOSIS — R51 Headache: Secondary | ICD-10-CM | POA: Diagnosis not present

## 2019-02-17 DIAGNOSIS — M799 Soft tissue disorder, unspecified: Secondary | ICD-10-CM | POA: Diagnosis not present

## 2019-02-17 DIAGNOSIS — M542 Cervicalgia: Secondary | ICD-10-CM | POA: Diagnosis not present

## 2019-02-17 DIAGNOSIS — M6281 Muscle weakness (generalized): Secondary | ICD-10-CM | POA: Diagnosis not present

## 2019-02-17 DIAGNOSIS — M2560 Stiffness of unspecified joint, not elsewhere classified: Secondary | ICD-10-CM | POA: Diagnosis not present

## 2019-02-20 DIAGNOSIS — M799 Soft tissue disorder, unspecified: Secondary | ICD-10-CM | POA: Diagnosis not present

## 2019-02-20 DIAGNOSIS — M542 Cervicalgia: Secondary | ICD-10-CM | POA: Diagnosis not present

## 2019-02-20 DIAGNOSIS — M6281 Muscle weakness (generalized): Secondary | ICD-10-CM | POA: Diagnosis not present

## 2019-02-20 DIAGNOSIS — M2560 Stiffness of unspecified joint, not elsewhere classified: Secondary | ICD-10-CM | POA: Diagnosis not present

## 2019-02-22 DIAGNOSIS — M2560 Stiffness of unspecified joint, not elsewhere classified: Secondary | ICD-10-CM | POA: Diagnosis not present

## 2019-02-22 DIAGNOSIS — M6281 Muscle weakness (generalized): Secondary | ICD-10-CM | POA: Diagnosis not present

## 2019-02-22 DIAGNOSIS — M542 Cervicalgia: Secondary | ICD-10-CM | POA: Diagnosis not present

## 2019-02-22 DIAGNOSIS — M799 Soft tissue disorder, unspecified: Secondary | ICD-10-CM | POA: Diagnosis not present

## 2019-02-24 DIAGNOSIS — M542 Cervicalgia: Secondary | ICD-10-CM | POA: Diagnosis not present

## 2019-02-24 DIAGNOSIS — M2560 Stiffness of unspecified joint, not elsewhere classified: Secondary | ICD-10-CM | POA: Diagnosis not present

## 2019-02-24 DIAGNOSIS — M6281 Muscle weakness (generalized): Secondary | ICD-10-CM | POA: Diagnosis not present

## 2019-02-24 DIAGNOSIS — M799 Soft tissue disorder, unspecified: Secondary | ICD-10-CM | POA: Diagnosis not present

## 2019-02-27 DIAGNOSIS — M799 Soft tissue disorder, unspecified: Secondary | ICD-10-CM | POA: Diagnosis not present

## 2019-02-27 DIAGNOSIS — M542 Cervicalgia: Secondary | ICD-10-CM | POA: Diagnosis not present

## 2019-02-27 DIAGNOSIS — M2560 Stiffness of unspecified joint, not elsewhere classified: Secondary | ICD-10-CM | POA: Diagnosis not present

## 2019-02-27 DIAGNOSIS — M6281 Muscle weakness (generalized): Secondary | ICD-10-CM | POA: Diagnosis not present

## 2019-03-01 DIAGNOSIS — M6281 Muscle weakness (generalized): Secondary | ICD-10-CM | POA: Diagnosis not present

## 2019-03-01 DIAGNOSIS — M542 Cervicalgia: Secondary | ICD-10-CM | POA: Diagnosis not present

## 2019-03-01 DIAGNOSIS — M2560 Stiffness of unspecified joint, not elsewhere classified: Secondary | ICD-10-CM | POA: Diagnosis not present

## 2019-03-01 DIAGNOSIS — M799 Soft tissue disorder, unspecified: Secondary | ICD-10-CM | POA: Diagnosis not present

## 2019-03-03 DIAGNOSIS — M542 Cervicalgia: Secondary | ICD-10-CM | POA: Diagnosis not present

## 2019-03-03 DIAGNOSIS — M2560 Stiffness of unspecified joint, not elsewhere classified: Secondary | ICD-10-CM | POA: Diagnosis not present

## 2019-03-03 DIAGNOSIS — M6281 Muscle weakness (generalized): Secondary | ICD-10-CM | POA: Diagnosis not present

## 2019-03-03 DIAGNOSIS — M799 Soft tissue disorder, unspecified: Secondary | ICD-10-CM | POA: Diagnosis not present

## 2019-03-06 DIAGNOSIS — M799 Soft tissue disorder, unspecified: Secondary | ICD-10-CM | POA: Diagnosis not present

## 2019-03-06 DIAGNOSIS — M542 Cervicalgia: Secondary | ICD-10-CM | POA: Diagnosis not present

## 2019-03-06 DIAGNOSIS — M2560 Stiffness of unspecified joint, not elsewhere classified: Secondary | ICD-10-CM | POA: Diagnosis not present

## 2019-03-06 DIAGNOSIS — M6281 Muscle weakness (generalized): Secondary | ICD-10-CM | POA: Diagnosis not present

## 2019-03-08 DIAGNOSIS — M799 Soft tissue disorder, unspecified: Secondary | ICD-10-CM | POA: Diagnosis not present

## 2019-03-08 DIAGNOSIS — M2560 Stiffness of unspecified joint, not elsewhere classified: Secondary | ICD-10-CM | POA: Diagnosis not present

## 2019-03-08 DIAGNOSIS — M542 Cervicalgia: Secondary | ICD-10-CM | POA: Diagnosis not present

## 2019-03-08 DIAGNOSIS — M6281 Muscle weakness (generalized): Secondary | ICD-10-CM | POA: Diagnosis not present

## 2019-03-10 DIAGNOSIS — M799 Soft tissue disorder, unspecified: Secondary | ICD-10-CM | POA: Diagnosis not present

## 2019-03-10 DIAGNOSIS — M542 Cervicalgia: Secondary | ICD-10-CM | POA: Diagnosis not present

## 2019-03-10 DIAGNOSIS — M6281 Muscle weakness (generalized): Secondary | ICD-10-CM | POA: Diagnosis not present

## 2019-03-10 DIAGNOSIS — M2560 Stiffness of unspecified joint, not elsewhere classified: Secondary | ICD-10-CM | POA: Diagnosis not present

## 2019-03-13 DIAGNOSIS — M2560 Stiffness of unspecified joint, not elsewhere classified: Secondary | ICD-10-CM | POA: Diagnosis not present

## 2019-03-13 DIAGNOSIS — M6281 Muscle weakness (generalized): Secondary | ICD-10-CM | POA: Diagnosis not present

## 2019-03-13 DIAGNOSIS — M542 Cervicalgia: Secondary | ICD-10-CM | POA: Diagnosis not present

## 2019-03-13 DIAGNOSIS — M799 Soft tissue disorder, unspecified: Secondary | ICD-10-CM | POA: Diagnosis not present

## 2019-03-15 DIAGNOSIS — M2560 Stiffness of unspecified joint, not elsewhere classified: Secondary | ICD-10-CM | POA: Diagnosis not present

## 2019-03-15 DIAGNOSIS — M542 Cervicalgia: Secondary | ICD-10-CM | POA: Diagnosis not present

## 2019-03-15 DIAGNOSIS — M6281 Muscle weakness (generalized): Secondary | ICD-10-CM | POA: Diagnosis not present

## 2019-03-15 DIAGNOSIS — M799 Soft tissue disorder, unspecified: Secondary | ICD-10-CM | POA: Diagnosis not present

## 2019-03-20 DIAGNOSIS — M799 Soft tissue disorder, unspecified: Secondary | ICD-10-CM | POA: Diagnosis not present

## 2019-03-20 DIAGNOSIS — M2560 Stiffness of unspecified joint, not elsewhere classified: Secondary | ICD-10-CM | POA: Diagnosis not present

## 2019-03-20 DIAGNOSIS — M6281 Muscle weakness (generalized): Secondary | ICD-10-CM | POA: Diagnosis not present

## 2019-03-20 DIAGNOSIS — M542 Cervicalgia: Secondary | ICD-10-CM | POA: Diagnosis not present

## 2019-03-22 DIAGNOSIS — M799 Soft tissue disorder, unspecified: Secondary | ICD-10-CM | POA: Diagnosis not present

## 2019-03-22 DIAGNOSIS — M542 Cervicalgia: Secondary | ICD-10-CM | POA: Diagnosis not present

## 2019-03-22 DIAGNOSIS — M2560 Stiffness of unspecified joint, not elsewhere classified: Secondary | ICD-10-CM | POA: Diagnosis not present

## 2019-03-22 DIAGNOSIS — M6281 Muscle weakness (generalized): Secondary | ICD-10-CM | POA: Diagnosis not present

## 2019-03-27 DIAGNOSIS — M2560 Stiffness of unspecified joint, not elsewhere classified: Secondary | ICD-10-CM | POA: Diagnosis not present

## 2019-03-27 DIAGNOSIS — M799 Soft tissue disorder, unspecified: Secondary | ICD-10-CM | POA: Diagnosis not present

## 2019-03-27 DIAGNOSIS — M6281 Muscle weakness (generalized): Secondary | ICD-10-CM | POA: Diagnosis not present

## 2019-03-27 DIAGNOSIS — M542 Cervicalgia: Secondary | ICD-10-CM | POA: Diagnosis not present

## 2019-03-29 DIAGNOSIS — M6281 Muscle weakness (generalized): Secondary | ICD-10-CM | POA: Diagnosis not present

## 2019-03-29 DIAGNOSIS — M542 Cervicalgia: Secondary | ICD-10-CM | POA: Diagnosis not present

## 2019-03-29 DIAGNOSIS — M799 Soft tissue disorder, unspecified: Secondary | ICD-10-CM | POA: Diagnosis not present

## 2019-03-29 DIAGNOSIS — M2560 Stiffness of unspecified joint, not elsewhere classified: Secondary | ICD-10-CM | POA: Diagnosis not present

## 2019-04-03 DIAGNOSIS — M2560 Stiffness of unspecified joint, not elsewhere classified: Secondary | ICD-10-CM | POA: Diagnosis not present

## 2019-04-03 DIAGNOSIS — M799 Soft tissue disorder, unspecified: Secondary | ICD-10-CM | POA: Diagnosis not present

## 2019-04-03 DIAGNOSIS — M542 Cervicalgia: Secondary | ICD-10-CM | POA: Diagnosis not present

## 2019-04-03 DIAGNOSIS — M6281 Muscle weakness (generalized): Secondary | ICD-10-CM | POA: Diagnosis not present

## 2019-04-05 DIAGNOSIS — M2560 Stiffness of unspecified joint, not elsewhere classified: Secondary | ICD-10-CM | POA: Diagnosis not present

## 2019-04-05 DIAGNOSIS — M542 Cervicalgia: Secondary | ICD-10-CM | POA: Diagnosis not present

## 2019-04-05 DIAGNOSIS — M6281 Muscle weakness (generalized): Secondary | ICD-10-CM | POA: Diagnosis not present

## 2019-04-05 DIAGNOSIS — M799 Soft tissue disorder, unspecified: Secondary | ICD-10-CM | POA: Diagnosis not present

## 2019-04-10 DIAGNOSIS — M799 Soft tissue disorder, unspecified: Secondary | ICD-10-CM | POA: Diagnosis not present

## 2019-04-10 DIAGNOSIS — M2560 Stiffness of unspecified joint, not elsewhere classified: Secondary | ICD-10-CM | POA: Diagnosis not present

## 2019-04-10 DIAGNOSIS — M6281 Muscle weakness (generalized): Secondary | ICD-10-CM | POA: Diagnosis not present

## 2019-04-10 DIAGNOSIS — M542 Cervicalgia: Secondary | ICD-10-CM | POA: Diagnosis not present

## 2019-04-12 DIAGNOSIS — M6281 Muscle weakness (generalized): Secondary | ICD-10-CM | POA: Diagnosis not present

## 2019-04-12 DIAGNOSIS — M542 Cervicalgia: Secondary | ICD-10-CM | POA: Diagnosis not present

## 2019-04-12 DIAGNOSIS — M2560 Stiffness of unspecified joint, not elsewhere classified: Secondary | ICD-10-CM | POA: Diagnosis not present

## 2019-04-12 DIAGNOSIS — M799 Soft tissue disorder, unspecified: Secondary | ICD-10-CM | POA: Diagnosis not present

## 2019-04-17 DIAGNOSIS — M542 Cervicalgia: Secondary | ICD-10-CM | POA: Diagnosis not present

## 2019-04-17 DIAGNOSIS — M799 Soft tissue disorder, unspecified: Secondary | ICD-10-CM | POA: Diagnosis not present

## 2019-04-17 DIAGNOSIS — M6281 Muscle weakness (generalized): Secondary | ICD-10-CM | POA: Diagnosis not present

## 2019-04-17 DIAGNOSIS — M2560 Stiffness of unspecified joint, not elsewhere classified: Secondary | ICD-10-CM | POA: Diagnosis not present

## 2019-04-19 DIAGNOSIS — M6281 Muscle weakness (generalized): Secondary | ICD-10-CM | POA: Diagnosis not present

## 2019-04-19 DIAGNOSIS — M2560 Stiffness of unspecified joint, not elsewhere classified: Secondary | ICD-10-CM | POA: Diagnosis not present

## 2019-04-19 DIAGNOSIS — M542 Cervicalgia: Secondary | ICD-10-CM | POA: Diagnosis not present

## 2019-04-19 DIAGNOSIS — M799 Soft tissue disorder, unspecified: Secondary | ICD-10-CM | POA: Diagnosis not present

## 2019-04-24 DIAGNOSIS — M799 Soft tissue disorder, unspecified: Secondary | ICD-10-CM | POA: Diagnosis not present

## 2019-04-24 DIAGNOSIS — M2560 Stiffness of unspecified joint, not elsewhere classified: Secondary | ICD-10-CM | POA: Diagnosis not present

## 2019-04-24 DIAGNOSIS — M542 Cervicalgia: Secondary | ICD-10-CM | POA: Diagnosis not present

## 2019-04-24 DIAGNOSIS — M6281 Muscle weakness (generalized): Secondary | ICD-10-CM | POA: Diagnosis not present

## 2019-04-26 DIAGNOSIS — M799 Soft tissue disorder, unspecified: Secondary | ICD-10-CM | POA: Diagnosis not present

## 2019-04-26 DIAGNOSIS — M6281 Muscle weakness (generalized): Secondary | ICD-10-CM | POA: Diagnosis not present

## 2019-04-26 DIAGNOSIS — M2560 Stiffness of unspecified joint, not elsewhere classified: Secondary | ICD-10-CM | POA: Diagnosis not present

## 2019-04-26 DIAGNOSIS — M542 Cervicalgia: Secondary | ICD-10-CM | POA: Diagnosis not present

## 2019-05-03 DIAGNOSIS — Z20828 Contact with and (suspected) exposure to other viral communicable diseases: Secondary | ICD-10-CM | POA: Diagnosis not present

## 2019-05-03 DIAGNOSIS — J029 Acute pharyngitis, unspecified: Secondary | ICD-10-CM | POA: Diagnosis not present

## 2019-05-03 DIAGNOSIS — R509 Fever, unspecified: Secondary | ICD-10-CM | POA: Diagnosis not present

## 2019-09-01 ENCOUNTER — Encounter: Payer: Self-pay | Admitting: Licensed Clinical Social Worker

## 2019-10-31 IMAGING — US US PELVIS COMPLETE WITH TRANSVAGINAL
1 series · 13 of 25 positions shown · non-contrast
Comparison: 12/13/2017

CLINICAL DATA: Family history of ovarian cancer, annual physical
exam

EXAM:
TRANSABDOMINAL AND TRANSVAGINAL ULTRASOUND OF PELVIS
TECHNIQUE: Both transabdominal and transvaginal ultrasound examinations of the
pelvis were performed. Transabdominal technique was performed for
global imaging of the pelvis including uterus, ovaries, adnexal
regions, and pelvic cul-de-sac. It was necessary to proceed with
endovaginal exam following the transabdominal exam to visualize the
endometrium and LEFT ovary.

[Series 1: us pelvis complete with transvaginal · 0.13mm/px · 13 of 62 slices shown]
[im 1/62]
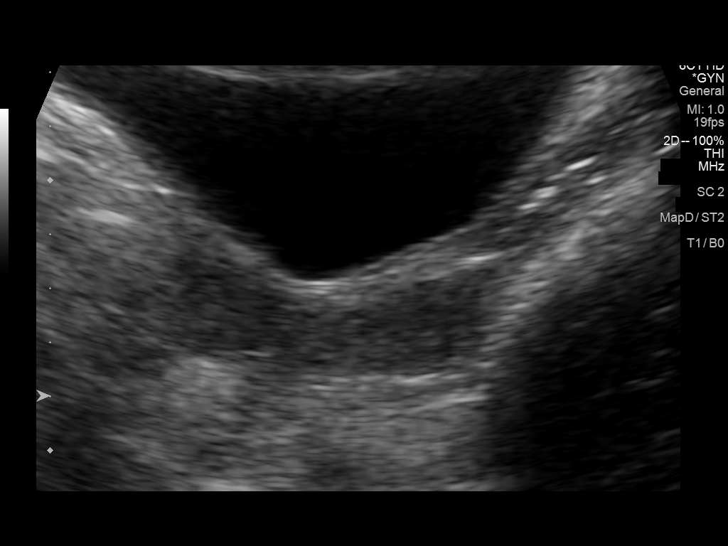
[im 6/62]
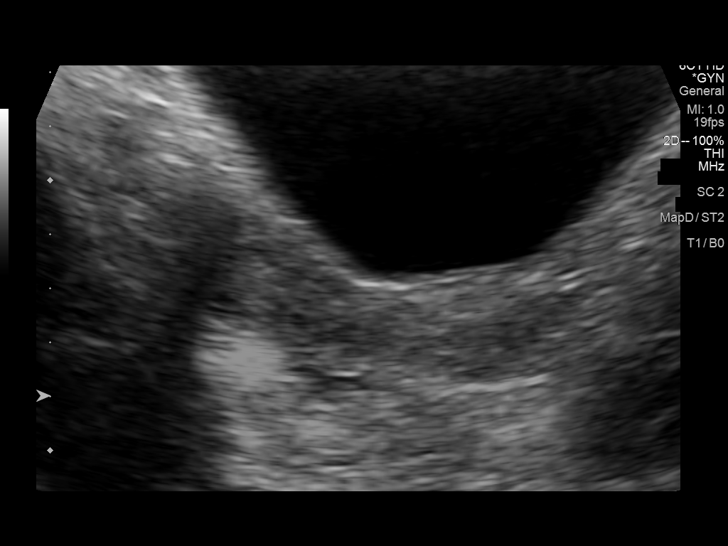
[im 11/62]
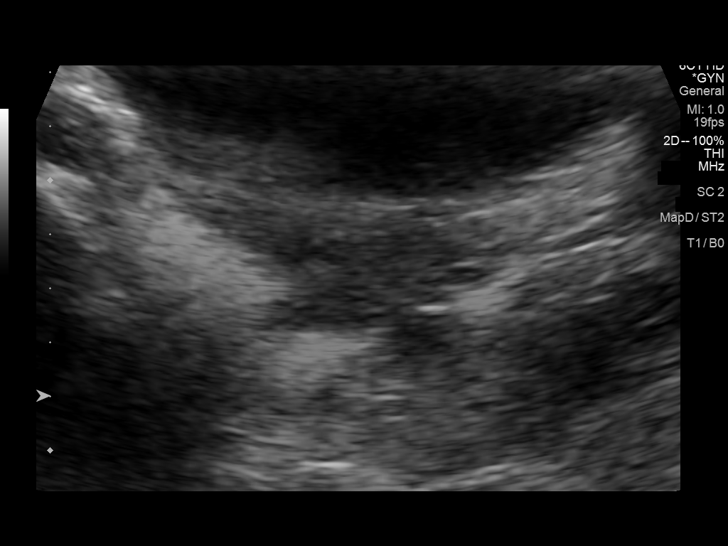
[im 16/62]
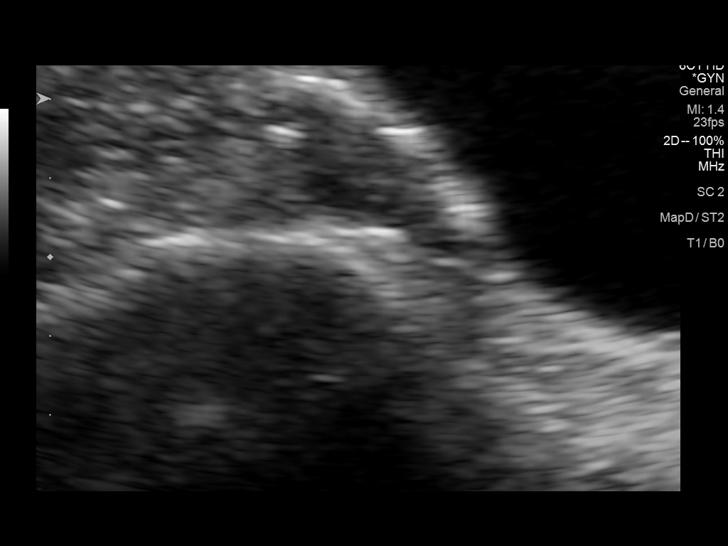
[im 21/62]
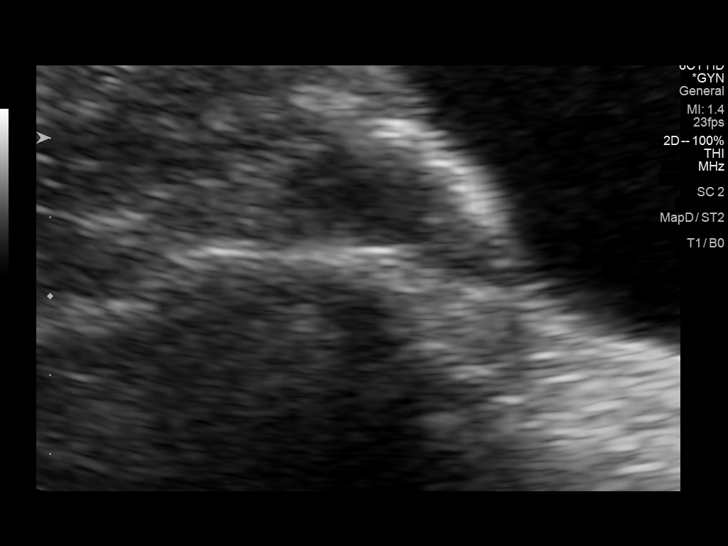
[im 26/62]
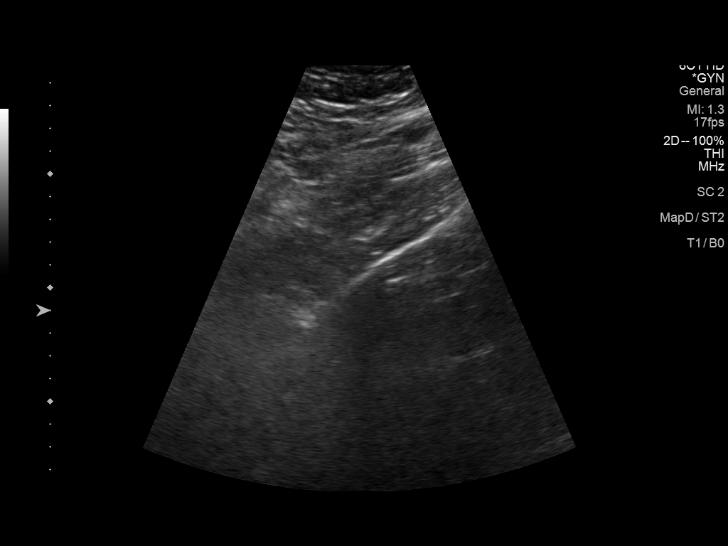
[im 31/62]
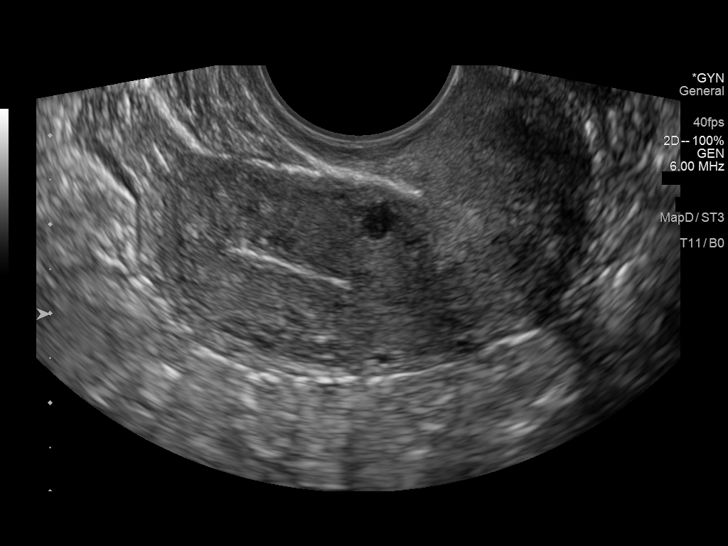
[im 36/62]
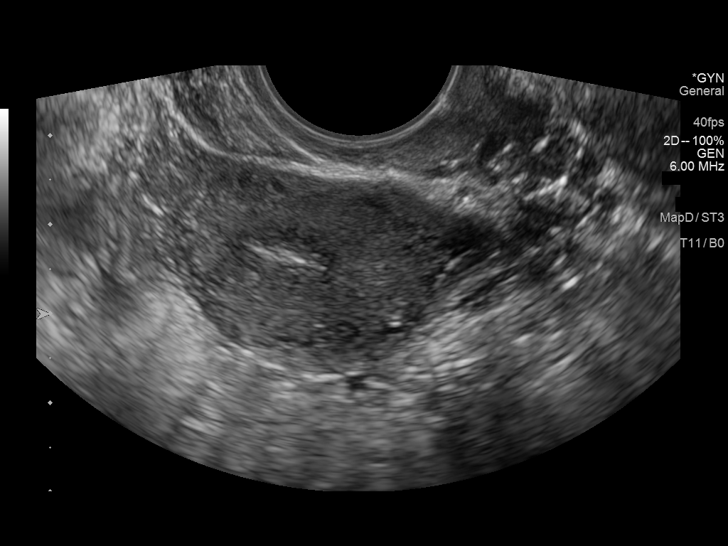
[im 41/62]
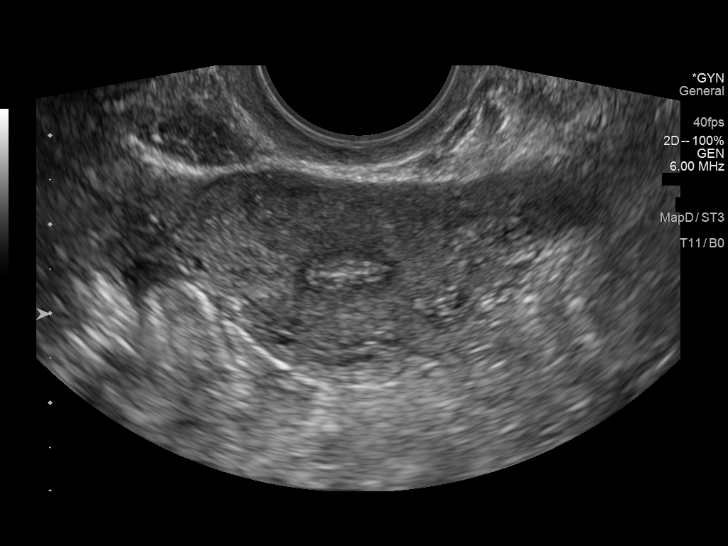
[im 46/62]
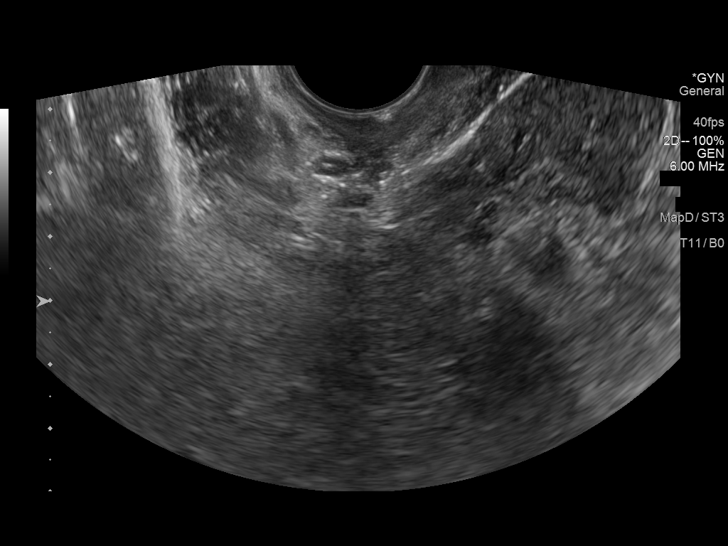
[im 51/62]
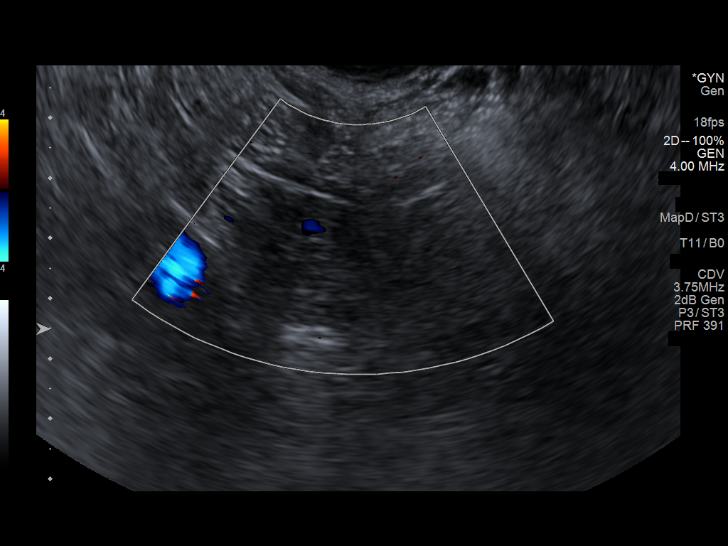
[im 56/62]
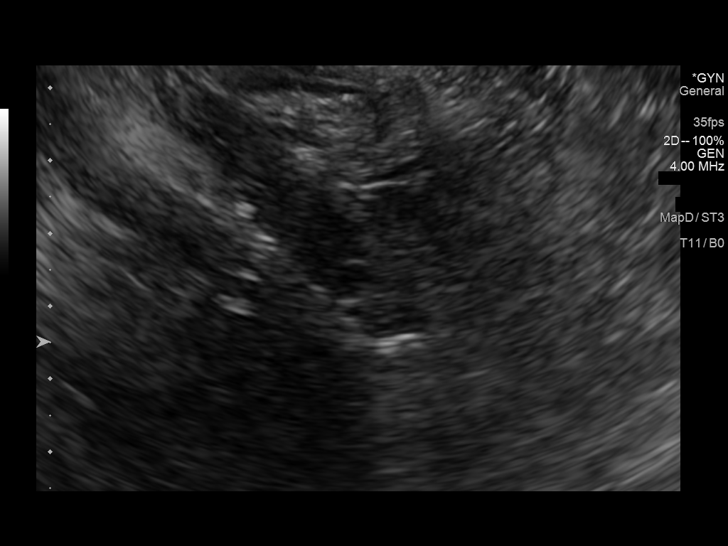
[im 62/62]
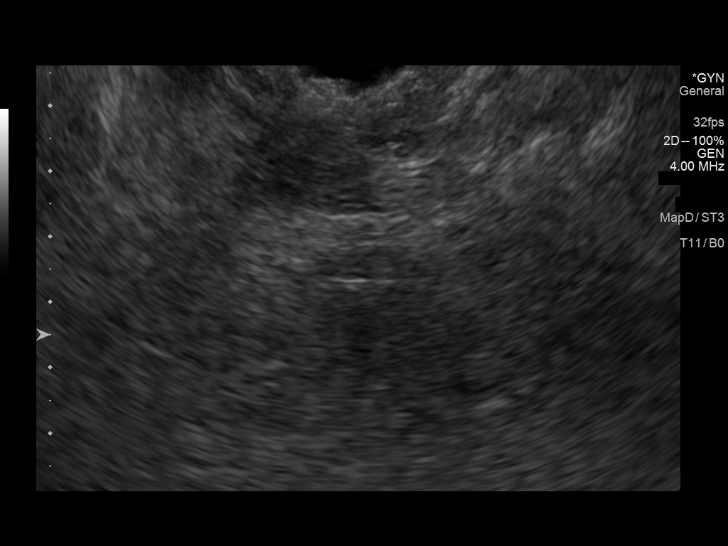

[13 of 25 positions shown; findings below may reference images not displayed]

FINDINGS: Uterus

Measurements: 6.1 x 2.0 x 3.8 cm = volume: 24 mL. Anteverted. Normal
morphology. Small hypoechoic nodule at anterior wall on transvaginal
imaging, question 5 x 4 x 4 mm leiomyoma. No additional masses.

Endometrium

Thickness: 2 mm, normal.  No endometrial fluid or focal abnormality

Right ovary

Measurements: 3.1 x 1.5 x 1.4 cm = volume: 3.3 mL. Normal morphology
without mass

Left ovary

Measurements: 3.6 x 2.2 x 2.2 cm = volume: 9.0 mL. Normal morphology
without mass

Other findings

No free pelvic fluid or adnexal masses.
IMPRESSION: Question small 5 mm intramural leiomyoma at anterior mid uterus.

Otherwise normal exam.

## 2020-01-09 DIAGNOSIS — Z7689 Persons encountering health services in other specified circumstances: Secondary | ICD-10-CM | POA: Diagnosis not present

## 2020-01-09 DIAGNOSIS — Z01419 Encounter for gynecological examination (general) (routine) without abnormal findings: Secondary | ICD-10-CM | POA: Diagnosis not present

## 2020-01-09 DIAGNOSIS — Z124 Encounter for screening for malignant neoplasm of cervix: Secondary | ICD-10-CM | POA: Diagnosis not present

## 2020-01-09 DIAGNOSIS — Z309 Encounter for contraceptive management, unspecified: Secondary | ICD-10-CM | POA: Diagnosis not present

## 2020-01-23 DIAGNOSIS — R635 Abnormal weight gain: Secondary | ICD-10-CM | POA: Diagnosis not present

## 2020-01-23 DIAGNOSIS — G43101 Migraine with aura, not intractable, with status migrainosus: Secondary | ICD-10-CM | POA: Diagnosis not present

## 2020-01-23 DIAGNOSIS — Z808 Family history of malignant neoplasm of other organs or systems: Secondary | ICD-10-CM | POA: Diagnosis not present

## 2020-01-23 DIAGNOSIS — E7849 Other hyperlipidemia: Secondary | ICD-10-CM | POA: Diagnosis not present

## 2020-01-29 DIAGNOSIS — E042 Nontoxic multinodular goiter: Secondary | ICD-10-CM | POA: Diagnosis not present

## 2020-01-29 DIAGNOSIS — Z808 Family history of malignant neoplasm of other organs or systems: Secondary | ICD-10-CM | POA: Diagnosis not present

## 2020-02-07 DIAGNOSIS — E042 Nontoxic multinodular goiter: Secondary | ICD-10-CM | POA: Diagnosis not present

## 2020-03-03 IMAGING — US US PELVIS COMPLETE TRANSABD/TRANSVAG
1 series · 13 of 25 positions shown · non-contrast
Comparison: Pelvic ultrasound dated October 30, 2015

CLINICAL DATA: Family history of ovarian malignancy. The patient is
on birth control pills.

EXAM:
TRANSABDOMINAL AND TRANSVAGINAL ULTRASOUND OF PELVIS
TECHNIQUE: Both transabdominal and transvaginal ultrasound examinations of the
pelvis were performed. Transabdominal technique was performed for
global imaging of the pelvis including uterus, ovaries, adnexal
regions, and pelvic cul-de-sac. It was necessary to proceed with
endovaginal exam following the transabdominal exam to visualize the
uterus, endometrium, ovaries, and adnexal structures..

[Series 1: us pelvis complete transabd/transvag · 0.26mm/px · 13 of 74 slices shown]
[im 1/74]
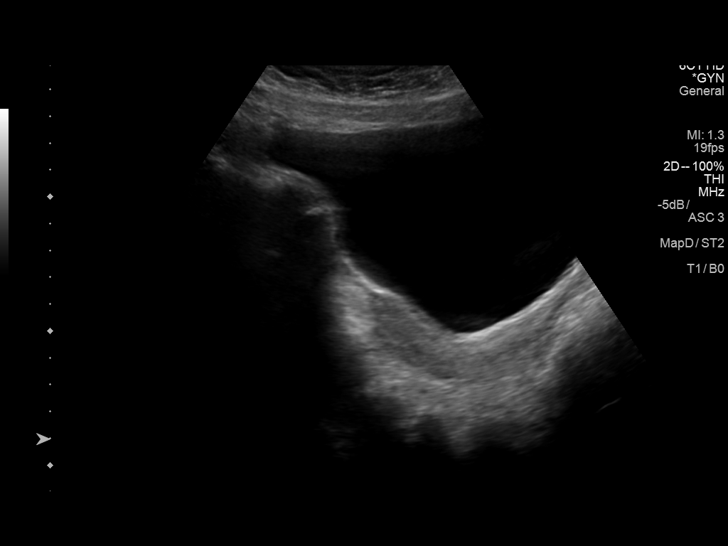
[im 7/74]
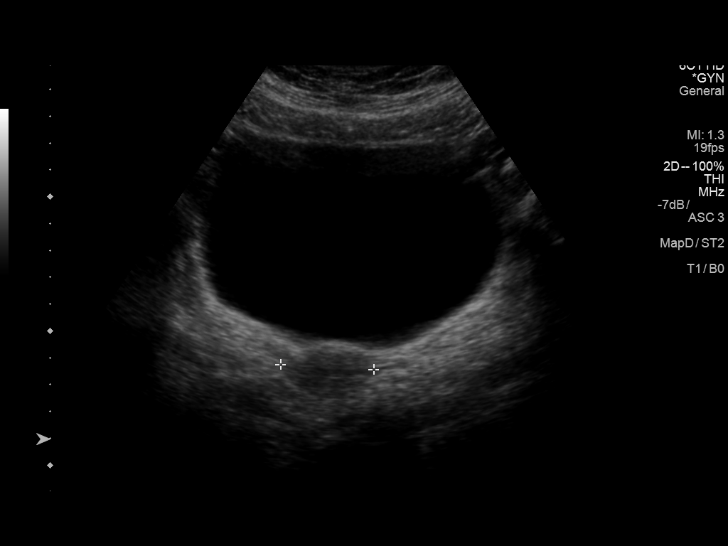
[im 13/74]
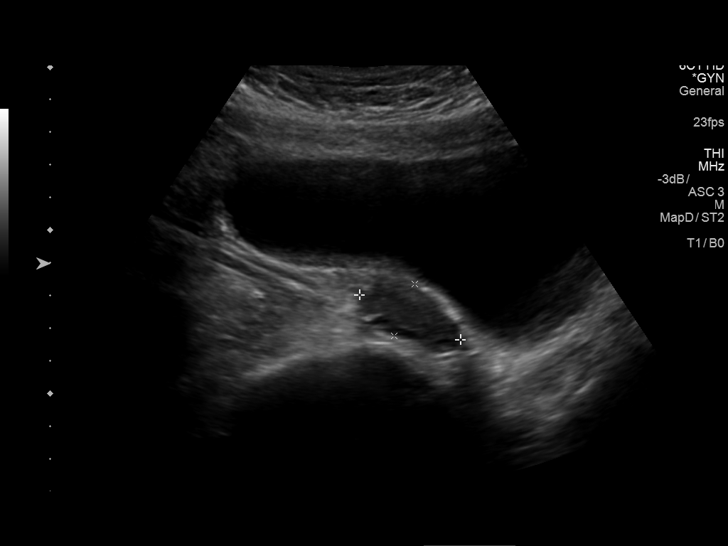
[im 19/74]
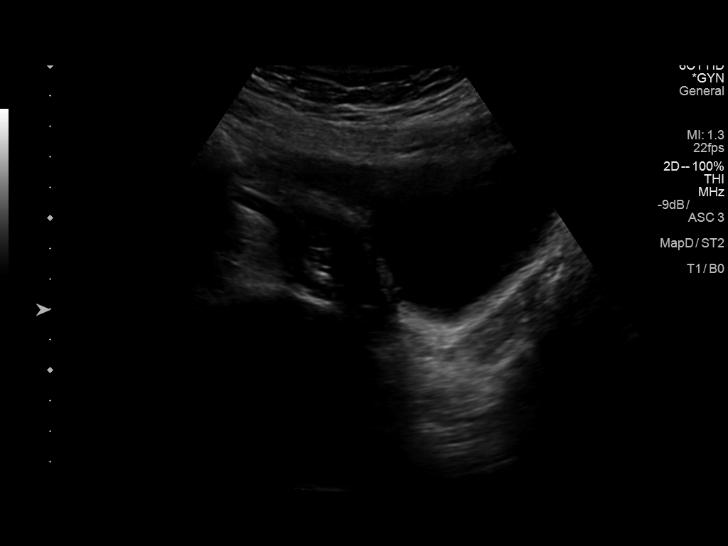
[im 25/74]
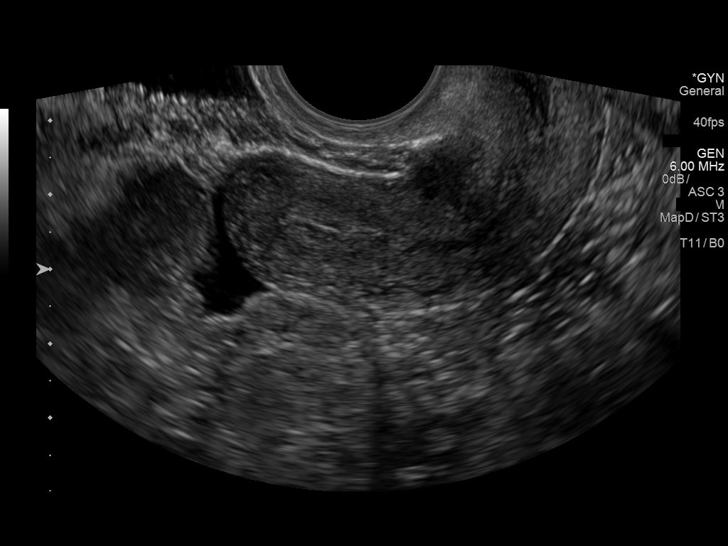
[im 31/74]
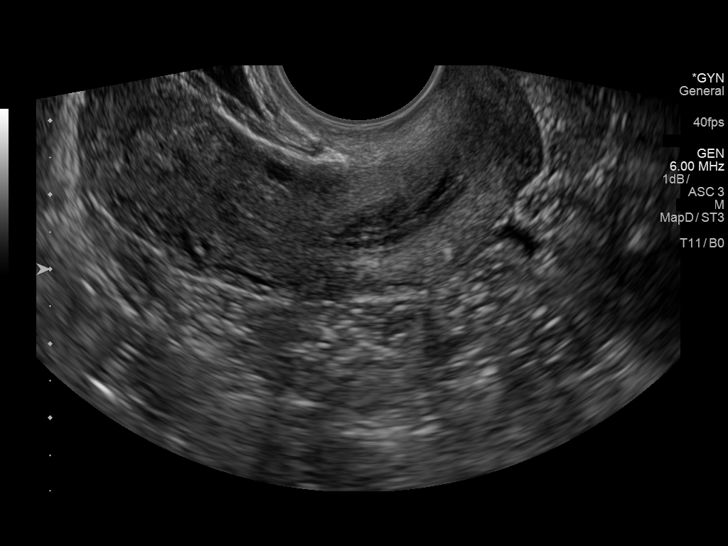
[im 37/74]
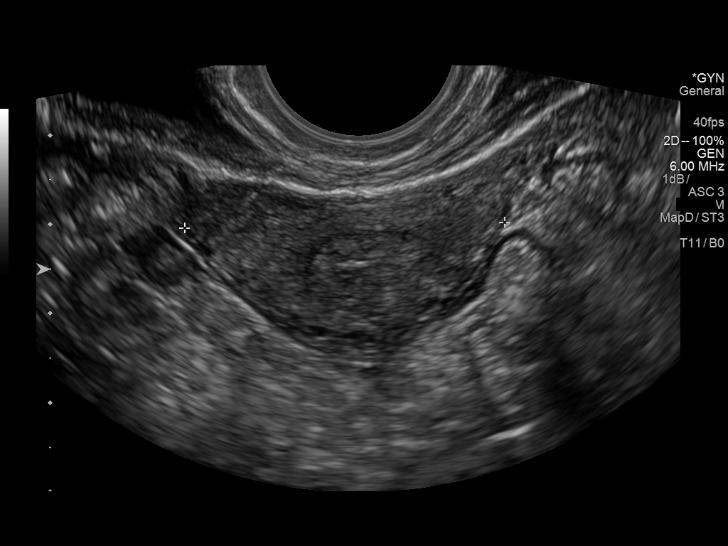
[im 43/74]
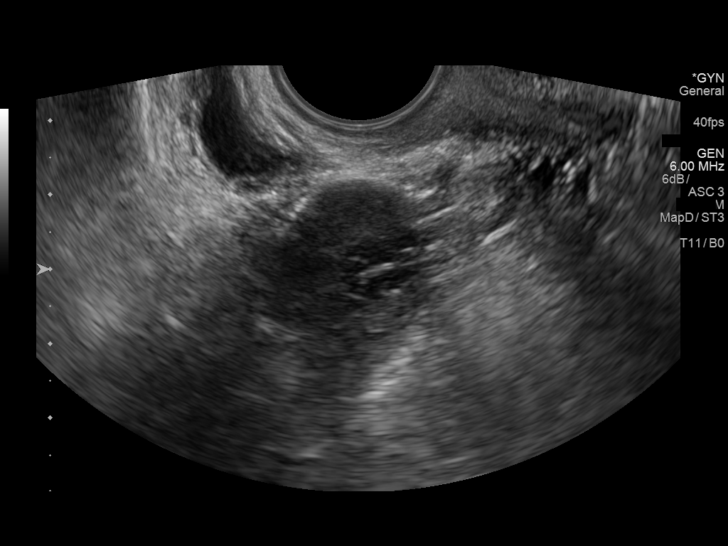
[im 49/74]
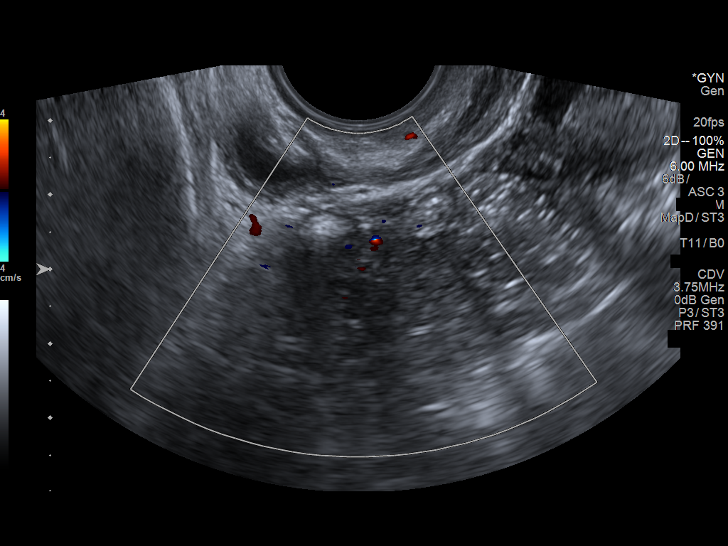
[im 55/74]
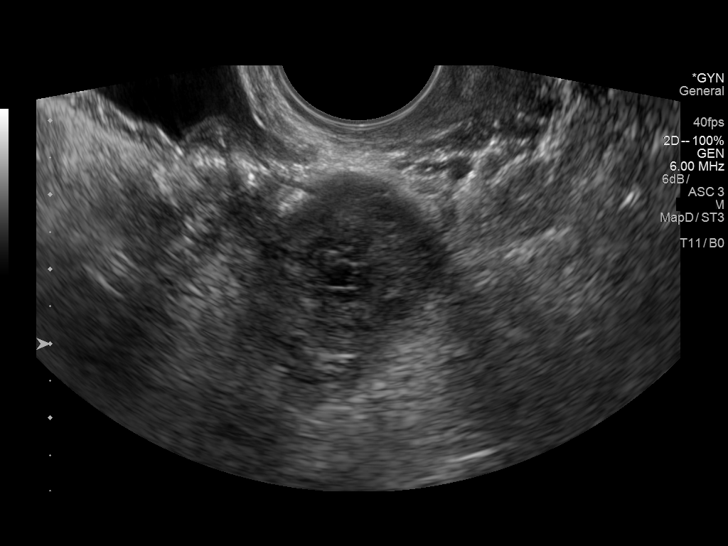
[im 61/74]
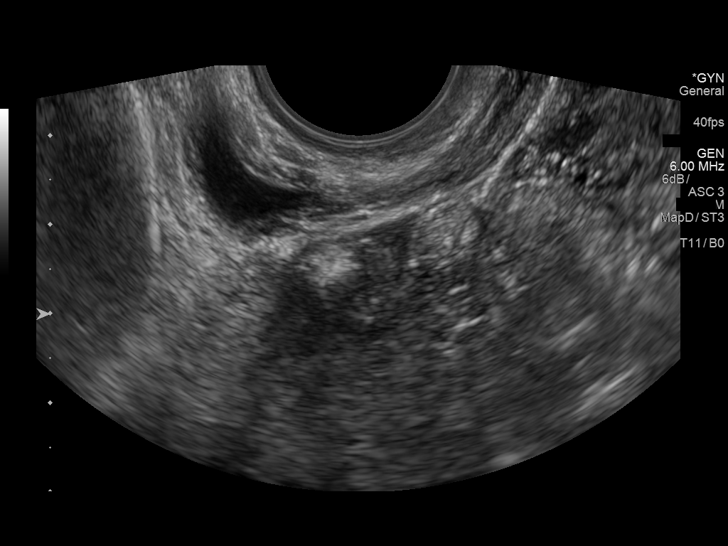
[im 67/74]
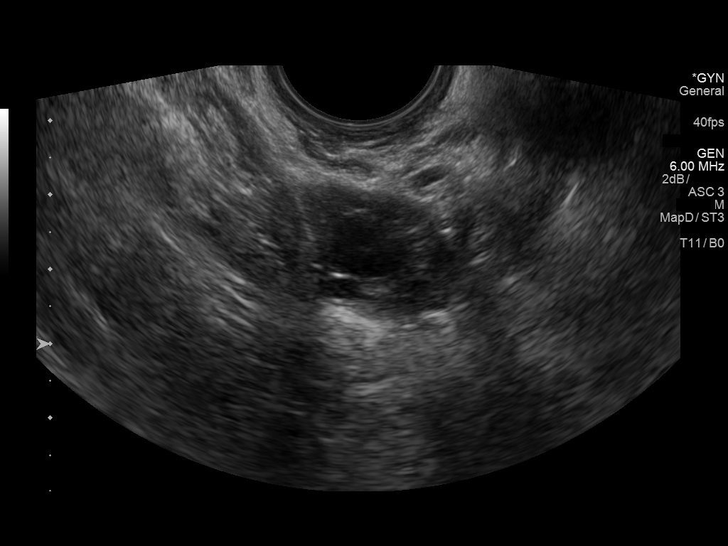
[im 74/74]
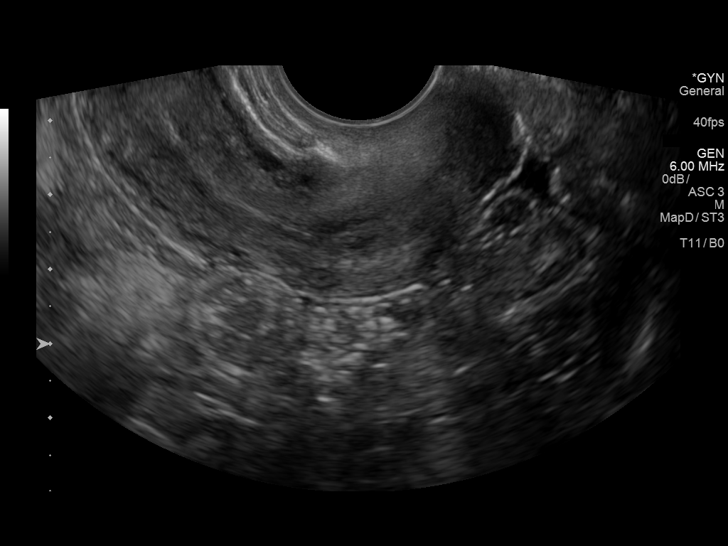

[13 of 25 positions shown; findings below may reference images not displayed]

FINDINGS: Uterus

Measurements: 8.5 x 2.0 x 3.5 cm. No fibroids or other mass
visualized.

Endometrium

Thickness: 6.4 mm.  No focal abnormality visualized.

Right ovary

Measurements: 2.4 x 3.1 x 2.2 cm. The echotexture is normal. There
is a hypoechoic to anechoic 6 mm structure in the periphery of the
right ovary.

Left ovary

Measurements: 2.5 x 1.7 x 1.8 cm. There is a hypoechoic follicle or
cyst in the left ovary measuring 1.0 x 1.1 x 1.1 cm.

Other findings

There is a small amount of free pelvic fluid adjacent to the uterine
fundus.
IMPRESSION: No suspicious ovarian or adnexal masses. The ovaries are normal in
size.

Normal appearance of the uterus and endometrium.

## 2020-05-23 DIAGNOSIS — M25532 Pain in left wrist: Secondary | ICD-10-CM | POA: Diagnosis not present

## 2020-05-23 DIAGNOSIS — M25432 Effusion, left wrist: Secondary | ICD-10-CM | POA: Diagnosis not present

## 2020-06-05 DIAGNOSIS — M25832 Other specified joint disorders, left wrist: Secondary | ICD-10-CM | POA: Diagnosis not present

## 2020-06-05 DIAGNOSIS — G5602 Carpal tunnel syndrome, left upper limb: Secondary | ICD-10-CM | POA: Diagnosis not present

## 2020-06-05 DIAGNOSIS — R200 Anesthesia of skin: Secondary | ICD-10-CM | POA: Diagnosis not present

## 2020-06-05 DIAGNOSIS — M25532 Pain in left wrist: Secondary | ICD-10-CM | POA: Diagnosis not present

## 2020-06-25 DIAGNOSIS — M79602 Pain in left arm: Secondary | ICD-10-CM | POA: Diagnosis not present

## 2020-07-01 DIAGNOSIS — M25832 Other specified joint disorders, left wrist: Secondary | ICD-10-CM | POA: Diagnosis not present

## 2020-07-01 DIAGNOSIS — M25532 Pain in left wrist: Secondary | ICD-10-CM | POA: Diagnosis not present

## 2020-07-12 DIAGNOSIS — Z20822 Contact with and (suspected) exposure to covid-19: Secondary | ICD-10-CM | POA: Diagnosis not present

## 2020-07-12 DIAGNOSIS — Z01812 Encounter for preprocedural laboratory examination: Secondary | ICD-10-CM | POA: Diagnosis not present

## 2020-07-16 DIAGNOSIS — E669 Obesity, unspecified: Secondary | ICD-10-CM | POA: Diagnosis not present

## 2020-07-16 DIAGNOSIS — M659 Synovitis and tenosynovitis, unspecified: Secondary | ICD-10-CM | POA: Diagnosis not present

## 2020-07-16 DIAGNOSIS — M25532 Pain in left wrist: Secondary | ICD-10-CM | POA: Diagnosis not present

## 2020-07-16 DIAGNOSIS — Z6838 Body mass index (BMI) 38.0-38.9, adult: Secondary | ICD-10-CM | POA: Diagnosis not present

## 2020-07-16 DIAGNOSIS — Z79899 Other long term (current) drug therapy: Secondary | ICD-10-CM | POA: Diagnosis not present

## 2020-07-16 DIAGNOSIS — E785 Hyperlipidemia, unspecified: Secondary | ICD-10-CM | POA: Diagnosis not present

## 2020-07-16 DIAGNOSIS — M25832 Other specified joint disorders, left wrist: Secondary | ICD-10-CM | POA: Diagnosis not present

## 2020-07-16 DIAGNOSIS — M67432 Ganglion, left wrist: Secondary | ICD-10-CM | POA: Diagnosis not present

## 2020-07-31 DIAGNOSIS — M25532 Pain in left wrist: Secondary | ICD-10-CM | POA: Diagnosis not present

## 2020-08-19 DIAGNOSIS — Z20822 Contact with and (suspected) exposure to covid-19: Secondary | ICD-10-CM | POA: Diagnosis not present
